# Patient Record
Sex: Male | Born: 1975 | Race: White | Hispanic: No | Marital: Married | Smoking: Never smoker
Health system: Southern US, Community
[De-identification: ages and names within clinical notes are randomized; demographics above are authoritative.]

---

## 2017-05-08 ENCOUNTER — Encounter (HOSPITAL_COMMUNITY): Payer: Self-pay | Admitting: *Deleted

## 2017-05-08 ENCOUNTER — Emergency Department (HOSPITAL_COMMUNITY): Payer: BC Managed Care – PPO

## 2017-05-08 ENCOUNTER — Emergency Department (HOSPITAL_COMMUNITY)
Admission: EM | Admit: 2017-05-08 | Discharge: 2017-05-08 | Disposition: A | Discharge status: Home / Self Care | Payer: BC Managed Care – PPO | Attending: Emergency Medicine | Admitting: Emergency Medicine

## 2017-05-08 DIAGNOSIS — K5732 Diverticulitis of large intestine without perforation or abscess without bleeding: Secondary | ICD-10-CM

## 2017-05-08 DIAGNOSIS — Z79899 Other long term (current) drug therapy: Secondary | ICD-10-CM | POA: Insufficient documentation

## 2017-05-08 DIAGNOSIS — R1032 Left lower quadrant pain: Secondary | ICD-10-CM | POA: Diagnosis present

## 2017-05-08 LAB — CBC
HEMATOCRIT: 45.3 % (ref 39.0–52.0)
HEMOGLOBIN: 16.2 g/dL (ref 13.0–17.0)
MCH: 31.1 pg (ref 26.0–34.0)
MCHC: 35.8 g/dL (ref 30.0–36.0)
MCV: 86.9 fL (ref 78.0–100.0)
Platelets: 162 10*3/uL (ref 150–400)
RBC: 5.21 MIL/uL (ref 4.22–5.81)
RDW: 12.8 % (ref 11.5–15.5)
WBC: 8.7 10*3/uL (ref 4.0–10.5)

## 2017-05-08 LAB — COMPREHENSIVE METABOLIC PANEL
ALT: 58 U/L (ref 17–63)
ANION GAP: 9 (ref 5–15)
AST: 35 U/L (ref 15–41)
Albumin: 4.2 g/dL (ref 3.5–5.0)
Alkaline Phosphatase: 75 U/L (ref 38–126)
BILIRUBIN TOTAL: 2 mg/dL — AB (ref 0.3–1.2)
BUN: 8 mg/dL (ref 6–20)
CALCIUM: 9 mg/dL (ref 8.9–10.3)
CO2: 27 mmol/L (ref 22–32)
Chloride: 104 mmol/L (ref 101–111)
Creatinine, Ser: 0.97 mg/dL (ref 0.61–1.24)
Glucose, Bld: 95 mg/dL (ref 65–99)
POTASSIUM: 3.8 mmol/L (ref 3.5–5.1)
Sodium: 140 mmol/L (ref 135–145)
TOTAL PROTEIN: 7.8 g/dL (ref 6.5–8.1)

## 2017-05-08 LAB — URINALYSIS, ROUTINE W REFLEX MICROSCOPIC
Bilirubin Urine: NEGATIVE
Glucose, UA: NEGATIVE mg/dL
Hgb urine dipstick: NEGATIVE
Ketones, ur: 20 mg/dL — AB
LEUKOCYTES UA: NEGATIVE
NITRITE: NEGATIVE
PH: 6 (ref 5.0–8.0)
Protein, ur: NEGATIVE mg/dL
SPECIFIC GRAVITY, URINE: 1.043 — AB (ref 1.005–1.030)

## 2017-05-08 LAB — LIPASE, BLOOD: Lipase: 24 U/L (ref 11–51)

## 2017-05-08 MED ORDER — ONDANSETRON HCL 4 MG/2ML IJ SOLN
4.0000 mg | Freq: Once | INTRAMUSCULAR | Status: AC
  Administered 2017-05-08: 4 mg via INTRAVENOUS
  Filled 2017-05-08: qty 2

## 2017-05-08 MED ORDER — MORPHINE SULFATE (PF) 4 MG/ML IV SOLN
4.0000 mg | Freq: Once | INTRAVENOUS | Status: AC
  Administered 2017-05-08: 4 mg via INTRAVENOUS
  Filled 2017-05-08: qty 1

## 2017-05-08 MED ORDER — IOPAMIDOL (ISOVUE-300) INJECTION 61%
100.0000 mL | Freq: Once | INTRAVENOUS | Status: AC | PRN
  Administered 2017-05-08: 100 mL via INTRAVENOUS

## 2017-05-08 MED ORDER — OXYCODONE-ACETAMINOPHEN 5-325 MG PO TABS: 1.0000 | ORAL_TABLET | ORAL | 0 refills | Status: AC | PRN

## 2017-05-08 MED ORDER — LEVOFLOXACIN 750 MG PO TABS: 750.0000 mg | ORAL_TABLET | Freq: Every day | ORAL | 0 refills | Status: AC

## 2017-05-08 MED ORDER — SODIUM CHLORIDE 0.9 % IV BOLUS
1000.0000 mL | Freq: Once | INTRAVENOUS | Status: AC
  Administered 2017-05-08: 1000 mL via INTRAVENOUS

## 2017-05-08 MED ORDER — LEVOFLOXACIN IN D5W 750 MG/150ML IV SOLN
750.0000 mg | Freq: Once | INTRAVENOUS | Status: AC
  Administered 2017-05-08: 750 mg via INTRAVENOUS
  Filled 2017-05-08: qty 150

## 2017-05-08 MED ORDER — METRONIDAZOLE 500 MG PO TABS: 500.0000 mg | ORAL_TABLET | Freq: Two times a day (BID) | ORAL | 0 refills | Status: AC

## 2017-05-08 MED ORDER — CIPROFLOXACIN IN D5W 400 MG/200ML IV SOLN
400.0000 mg | Freq: Once | INTRAVENOUS | Status: DC
  Filled 2017-05-08: qty 200

## 2017-05-08 NOTE — ED Notes (Signed)
Pt ambulated to restroom without assistance.

## 2017-05-08 NOTE — ED Notes (Signed)
He is attempting to obtain a urine sample at this time.

## 2017-05-08 NOTE — ED Provider Notes (Signed)
[facility]-EMERGENCY DEPT Provider Note   CSN: [id number] Arrival date & time: 05/08/17  1217     History   Chief Complaint Chief Complaint  Patient presents with  . Abdominal Pain    HPI Wayne Pickettatrick Martin is a 42 y.o. male.  HPI Patient presents to the emergency department with right-sided abdominal pain that started yesterday morning.  Patient states the pain got more intense this time went on.  The patient states that he did not take any medications prior to arrival he states that he woke up this morning was more painful.  He states that he called his primary doctor who saw him in the office today and sent him here for CT scan and further evaluation.  Patient states that movement and palpation makes the pain worse.  The patient denies chest pain, shortness of breath, headache,blurred vision, neck pain, fever, cough, weakness, numbness, dizziness, anorexia, edema,vomiting, diarrhea, rash, back pain, dysuria, hematemesis, bloody stool, near syncope, or syncope. History reviewed. No pertinent past medical history.  There are no active problems to display for this patient.   History reviewed. No pertinent surgical history.      Home Medications    Prior to Admission medications   Medication Sig Start Date End Date Taking? Authorizing Provider  fluticasone (FLONASE) 50 MCG/ACT nasal spray Place 1 spray into both nostrils daily. 11/23/15  Yes [provider]    Family History No family history on file.  Social History Social History   Tobacco Use  . Smoking status: Never Smoker  . Smokeless tobacco: Never Used  Substance Use Topics  . Alcohol use: Yes  . Drug use: Not on file     Allergies   Penicillins   Review of Systems Review of Systems All other systems negative except as documented in the HPI. All pertinent positives and negatives as reviewed in the HPI. Physical Exam Updated Vital Signs BP (!) 142/103 (BP Location: Left Arm)    Pulse 100   Temp 97.6 F (36.4 C) (Oral)   Resp 18   SpO2 96%   Physical Exam  Constitutional: He is oriented to person, place, and time. He appears well-developed and well-nourished. No distress.  HENT:  Head: Normocephalic and atraumatic.  Mouth/Throat: Oropharynx is clear and moist.  Eyes: Pupils are equal, round, and reactive to light.  Neck: Normal range of motion. Neck supple.  Cardiovascular: Normal rate, regular rhythm and normal heart sounds. Exam reveals no gallop and no friction rub.  No murmur heard. Pulmonary/Chest: Effort normal and breath sounds normal. No respiratory distress. He has no wheezes.  Abdominal: Soft. Bowel sounds are normal. He exhibits no distension. There is tenderness in the left upper quadrant and left lower quadrant. There is guarding. There is no rigidity, no rebound and negative Murphy's sign.  Neurological: He is alert and oriented to person, place, and time. He exhibits normal muscle tone. Coordination normal.  Skin: Skin is warm and dry. Capillary refill takes less than 2 seconds. No rash noted. No erythema.  Psychiatric: He has a normal mood and affect. His behavior is normal.  Nursing note and vitals reviewed.    ED Treatments / Results  Labs (all labs ordered are listed, but only abnormal results are displayed) Labs Reviewed  COMPREHENSIVE METABOLIC PANEL - Abnormal; Notable for the following components:      Result Value   Total Bilirubin 2.0 (*)    All other components within normal limits  LIPASE, BLOOD  CBC  URINALYSIS, ROUTINE W REFLEX MICROSCOPIC    EKG None  Radiology Ct Abdomen Pelvis W Contrast  Result Date: 05/08/2017 CLINICAL DATA:  LEFT lower quadrant pain since yesterday morning, no relief from Pepto-Bismol, acute generalized abdominal pain question abdominal infection EXAM: CT ABDOMEN AND PELVIS WITH CONTRAST TECHNIQUE: Multidetector CT imaging of the abdomen and pelvis was performed using the standard protocol  following bolus administration of intravenous contrast. Sagittal and coronal MPR images reconstructed from axial data set. CONTRAST:  ISOVUE-300 IOPAMIDOL (ISOVUE-300) INJECTION 61% IV. No oral contrast administered. COMPARISON:  None FINDINGS: Lower chest: Dependent atelectasis at both lung bases. Gallbladder and liver normal appearance Hepatobiliary: Normal appearance Pancreas: Normal appearance Spleen: Adrenal glands, kidneys, ureters, and bladder normal appearance Adrenals/Urinary Tract: Normal appendix. Diverticulosis of distal transverse through sigmoid colon. Focal wall thickening and pericolic inflammatory changes at mid descending colon likely reflecting acute diverticulitis. Slight thickening of the lateral conal fascia. No extraluminal gas or abscess collection. Stomach and remaining bowel loops unremarkable. Stomach/Bowel: Vascular structures unremarkable Vascular/Lymphatic: Normal appearance Reproductive: Normal appearance Other: Small LEFT inguinal and umbilical hernias containing fat. No free air or free fluid. Musculoskeletal: Unremarkable IMPRESSION: Descending sigmoid diverticulitis without evidence of perforation or abscess. Small LEFT inguinal and umbilical hernias containing fat. Electronically Signed   By: Ulyses Southward M.D.   On: 05/08/2017 14:45    Procedures Procedures (including critical care time)  Medications Ordered in ED Medications  sodium chloride 0.9 % bolus 1,000 mL (1,000 mLs Intravenous New Bag/Given 05/08/17 1402)  morphine 4 MG/ML injection 4 mg (4 mg Intravenous Given 05/08/17 1402)  ondansetron (ZOFRAN) injection 4 mg (4 mg Intravenous Given 05/08/17 1402)  iopamidol (ISOVUE-300) 61 % injection 100 mL (100 mLs Intravenous Contrast Given 05/08/17 1424)     Initial Impression / Assessment and Plan / ED Course  I have reviewed the triage vital signs and the nursing notes.  Pertinent labs & imaging results that were available during my care of the patient were  reviewed by me and considered in my medical decision making (see chart for details).     Patient is feeling better at this time.  Patient states he would like to be discharged home if at all possible.  I did advise him that he would have to return for worsening in his pain nausea vomiting or fever.  We will give him follow-up with general surgery.  Final Clinical Impressions(s) / ED Diagnoses   Final diagnoses:  None    ED Discharge Orders    None       Charlestine Night, PA-C 05/08/17 1611    Rolan Bucco, MD 05/08/17 2354

## 2017-05-08 NOTE — ED Triage Notes (Signed)
Pt complains of LLQ pain since yesterday morning. Pt has tried pepto-bismal w/o relief. Pt denies n/v/d. Pt has not noticed blood in stool.

## 2017-05-08 NOTE — ED Notes (Signed)
Pt concerned about a previous reaction that he thinks he had to cipro. PA made aware

## 2017-05-08 NOTE — Discharge Instructions (Addendum)
Return here as needed.  Follow-up with the surgeon provided.  Return for any worsening in your condition.

## 2017-05-08 NOTE — ED Notes (Signed)
Urine culture sent down with a save tube label

## 2017-05-08 NOTE — ED Notes (Signed)
Pt declined vitals recheck. 

## 2019-04-25 IMAGING — CT CT ABD-PELV W/ CM
2 of 4 series · 16 of 46 positions shown, 18 images · IV contrast (ISOVUE)
Comparison: None

CLINICAL DATA: LEFT lower quadrant pain since yesterday morning, no
relief from Pepto-Bismol, acute generalized abdominal pain question
abdominal infection

EXAM:
CT ABDOMEN AND PELVIS WITH CONTRAST
TECHNIQUE: Multidetector CT imaging of the abdomen and pelvis was performed
using the standard protocol following bolus administration of
intravenous contrast. Sagittal and coronal MPR images reconstructed
from axial data set.
CONTRAST:  100mL [id number] IOPAMIDOL ([id number]) INJECTION 61% IV.
No oral contrast administered.

[Series 2: axial st · axial · 0.77mm/px · z∈[-508,-53]mm · 13 of 103 slices shown, 15 images]
[im 6/103  soft-tissue]
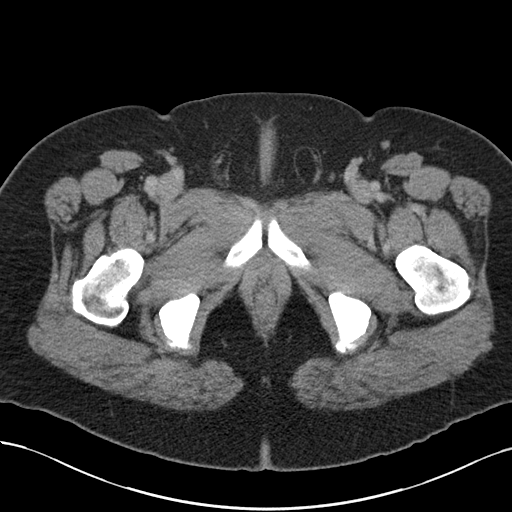
[im 6/103  bone]
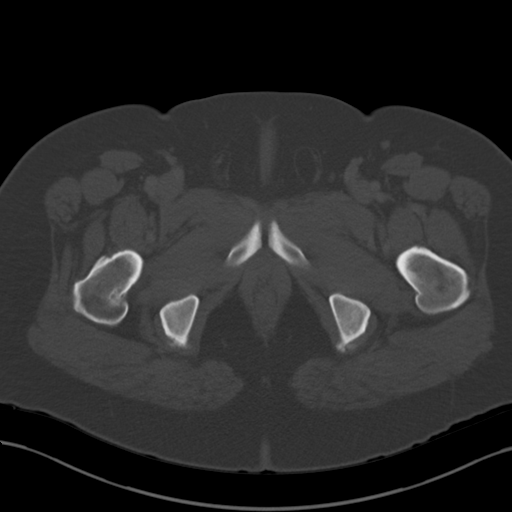
[im 12/103  soft-tissue]
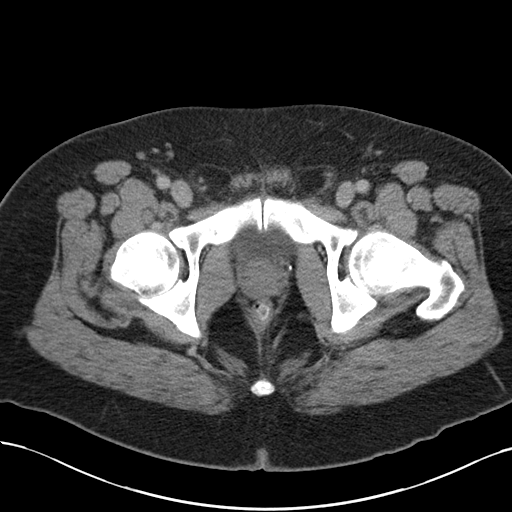
[im 23/103  soft-tissue]
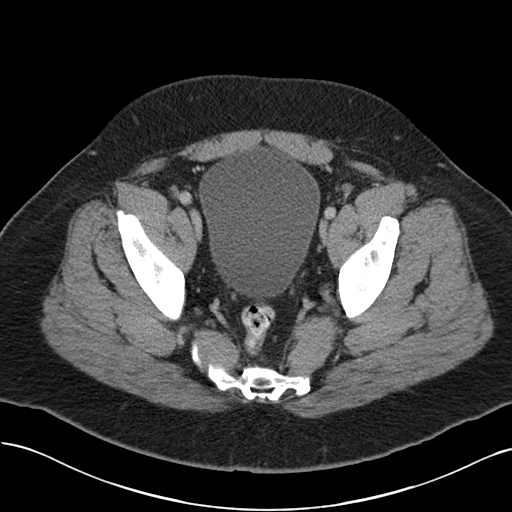
[im 29/103  soft-tissue]
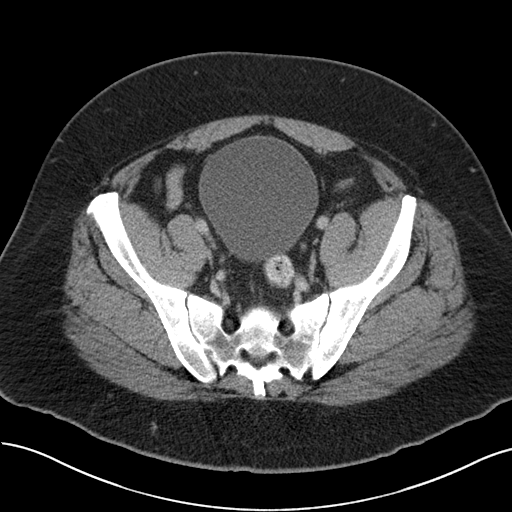
[im 35/103  soft-tissue]
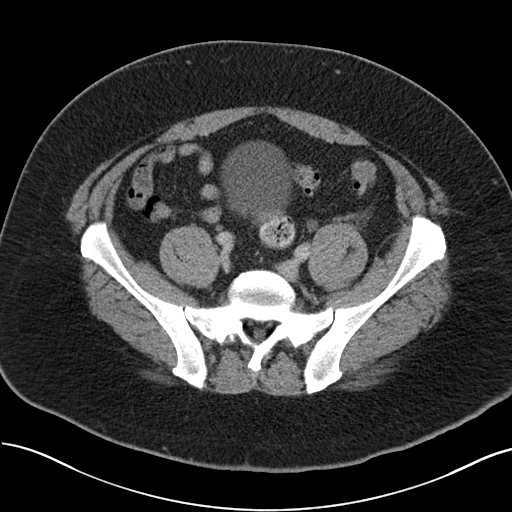
[im 46/103  soft-tissue]
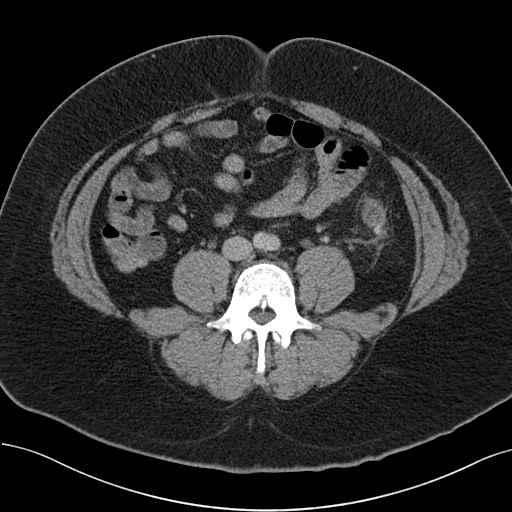
[im 52/103  soft-tissue]
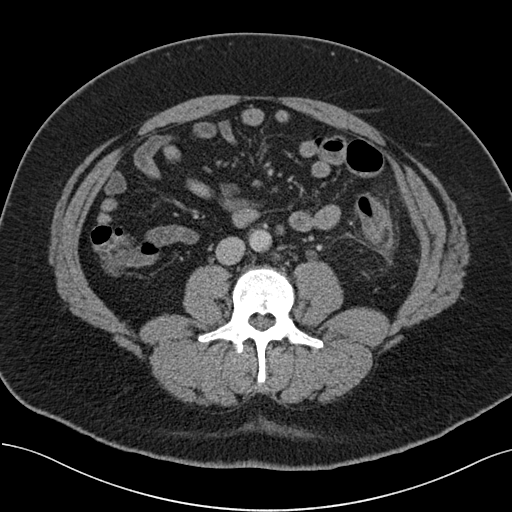
[im 57/103  soft-tissue]
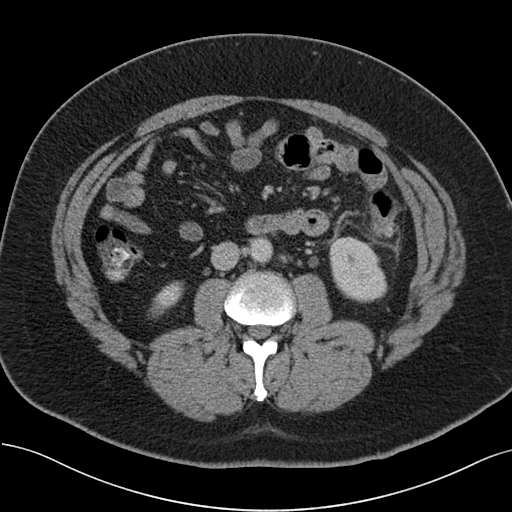
[im 69/103  soft-tissue]
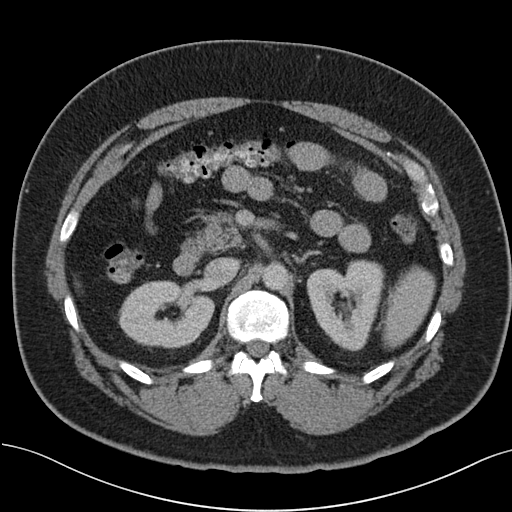
[im 69/103  bone]
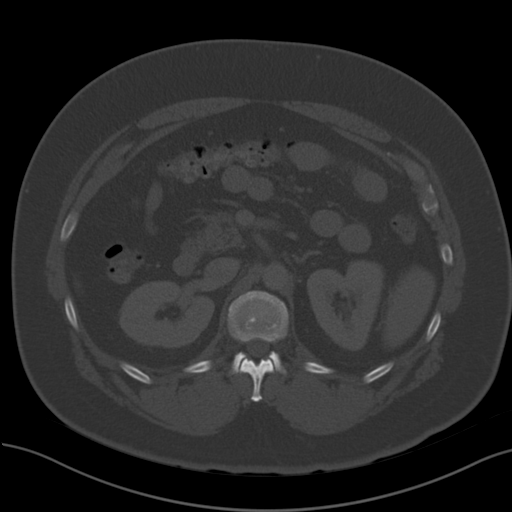
[im 74/103  soft-tissue]
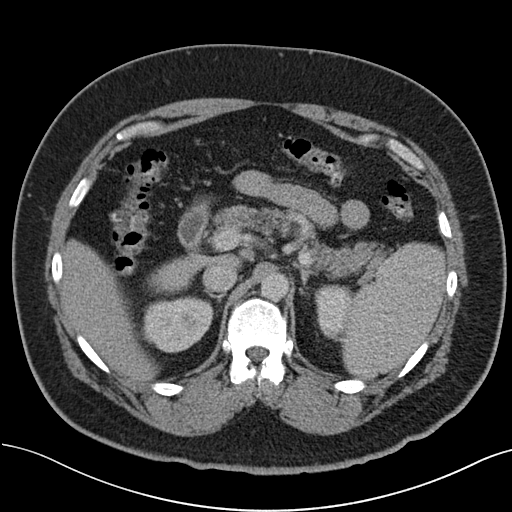
[im 80/103  soft-tissue]
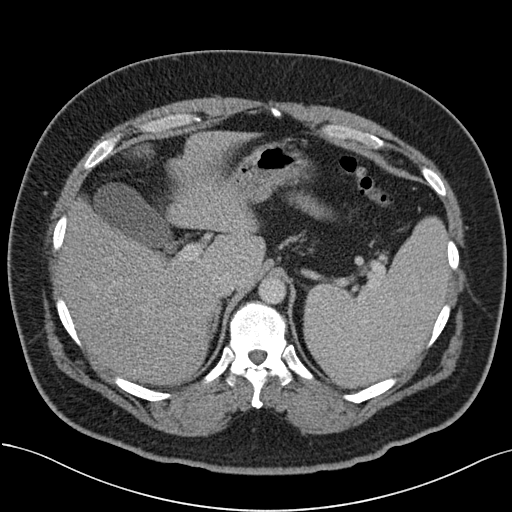
[im 91/103  soft-tissue]
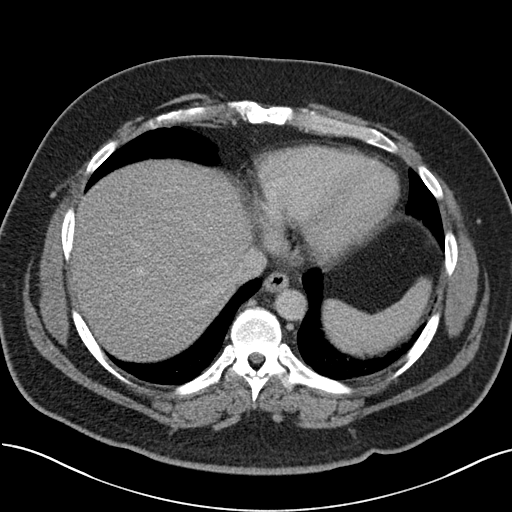
[im 97/103  soft-tissue]
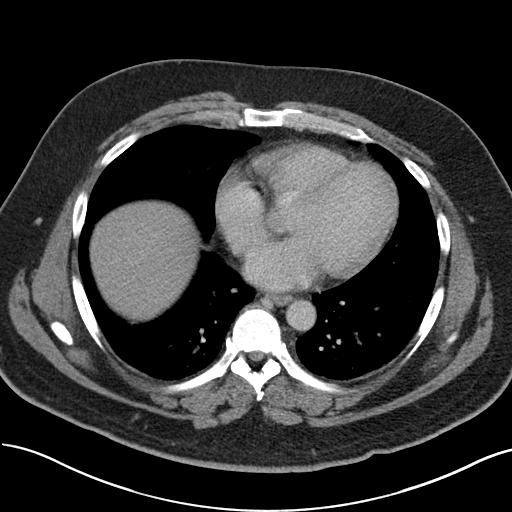

[Series 4: coronal st · coronal · 0.93mm/px · 3 of 91 slices shown]
[im 31/91  soft-tissue]
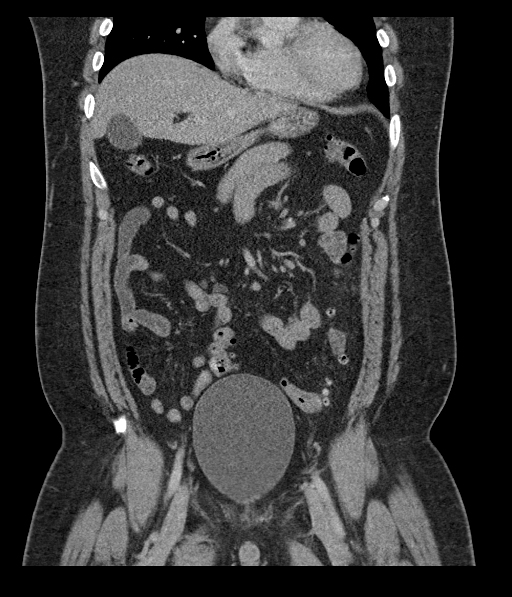
[im 41/91  soft-tissue]
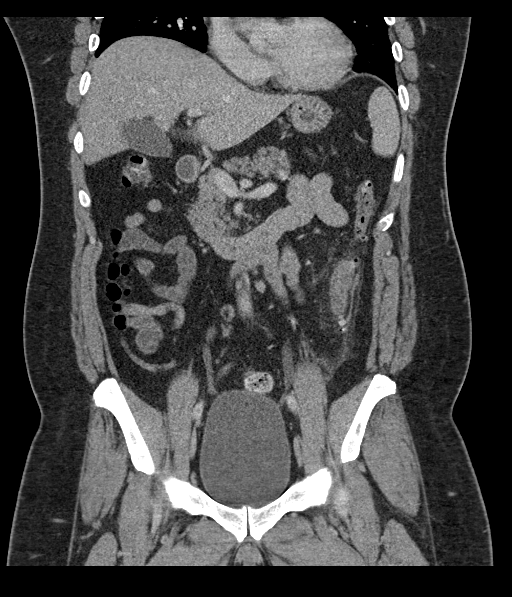
[im 51/91  soft-tissue]
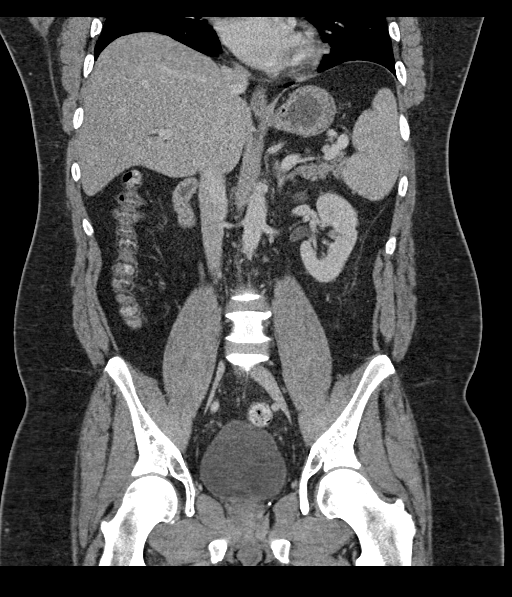

[16 of 46 positions shown; findings below may reference images not displayed]

FINDINGS: Lower chest: Dependent atelectasis at both lung bases. Gallbladder
and liver normal appearance

Hepatobiliary: Normal appearance

Pancreas: Normal appearance

Spleen: Adrenal glands, kidneys, ureters, and bladder normal
appearance

Adrenals/Urinary Tract: Normal appendix. Diverticulosis of distal
transverse through sigmoid colon. Focal wall thickening and
pericolic inflammatory changes at mid descending colon likely
reflecting acute diverticulitis. Slight thickening of the lateral
conal fascia. No extraluminal gas or abscess collection. Stomach and
remaining bowel loops unremarkable.

Stomach/Bowel: Vascular structures unremarkable

Vascular/Lymphatic: Normal appearance

Reproductive: Normal appearance

Other: Small LEFT inguinal and umbilical hernias containing fat. No
free air or free fluid.

Musculoskeletal: Unremarkable
IMPRESSION: Descending sigmoid diverticulitis without evidence of perforation or
abscess.

Small LEFT inguinal and umbilical hernias containing fat.

## 2022-05-10 ENCOUNTER — Other Ambulatory Visit (HOSPITAL_BASED_OUTPATIENT_CLINIC_OR_DEPARTMENT_OTHER): Payer: Self-pay | Admitting: Family Medicine

## 2022-05-10 DIAGNOSIS — R7989 Other specified abnormal findings of blood chemistry: Secondary | ICD-10-CM

## 2022-05-21 ENCOUNTER — Ambulatory Visit (HOSPITAL_BASED_OUTPATIENT_CLINIC_OR_DEPARTMENT_OTHER)
Admission: RE | Admit: 2022-05-21 | Discharge: 2022-05-21 | Disposition: A | Discharge status: Home / Self Care | Payer: BC Managed Care – PPO | Source: Ambulatory Visit | Attending: Family Medicine | Admitting: Family Medicine

## 2022-05-21 DIAGNOSIS — R7989 Other specified abnormal findings of blood chemistry: Secondary | ICD-10-CM | POA: Insufficient documentation

## 2022-07-16 ENCOUNTER — Other Ambulatory Visit: Payer: Self-pay | Admitting: Internal Medicine

## 2022-07-16 DIAGNOSIS — R748 Abnormal levels of other serum enzymes: Secondary | ICD-10-CM

## 2022-07-16 DIAGNOSIS — K760 Fatty (change of) liver, not elsewhere classified: Secondary | ICD-10-CM
